# Patient Record
Sex: Male | Born: 2005 | Race: Black or African American | Hispanic: No | Marital: Single
Health system: Midwestern US, Community
[De-identification: ages and names within clinical notes are randomized; demographics above are authoritative.]

---
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
## 2023-01-25 DIAGNOSIS — S0285XS Fracture of orbit, unspecified, sequela: Secondary | ICD-10-CM | POA: Diagnosis not present

## 2023-01-25 DIAGNOSIS — H02411 Mechanical ptosis of right eyelid: Secondary | ICD-10-CM | POA: Diagnosis not present

## 2023-01-31 DIAGNOSIS — H4311 Vitreous hemorrhage, right eye: Secondary | ICD-10-CM | POA: Diagnosis not present

## 2023-01-31 DIAGNOSIS — H26131 Total traumatic cataract, right eye: Secondary | ICD-10-CM | POA: Diagnosis not present

## 2023-02-01 DIAGNOSIS — H1820 Unspecified corneal edema: Secondary | ICD-10-CM | POA: Diagnosis not present

## 2023-02-01 DIAGNOSIS — Z961 Presence of intraocular lens: Secondary | ICD-10-CM | POA: Diagnosis not present

## 2023-03-21 DIAGNOSIS — F1090 Alcohol use, unspecified, uncomplicated: Secondary | ICD-10-CM | POA: Diagnosis not present

## 2023-03-21 DIAGNOSIS — H179 Unspecified corneal scar and opacity: Secondary | ICD-10-CM | POA: Diagnosis not present

## 2023-03-21 DIAGNOSIS — H1820 Unspecified corneal edema: Secondary | ICD-10-CM | POA: Diagnosis not present

## 2023-03-22 DIAGNOSIS — Z961 Presence of intraocular lens: Secondary | ICD-10-CM | POA: Diagnosis not present

## 2023-03-22 DIAGNOSIS — Z947 Corneal transplant status: Secondary | ICD-10-CM | POA: Diagnosis not present

## 2023-03-24 DIAGNOSIS — Z947 Corneal transplant status: Secondary | ICD-10-CM | POA: Diagnosis not present

## 2023-03-24 DIAGNOSIS — Z961 Presence of intraocular lens: Secondary | ICD-10-CM | POA: Diagnosis not present

## 2023-04-03 DIAGNOSIS — Z961 Presence of intraocular lens: Secondary | ICD-10-CM | POA: Diagnosis not present

## 2023-04-03 DIAGNOSIS — Z947 Corneal transplant status: Secondary | ICD-10-CM | POA: Diagnosis not present

## 2023-04-03 DIAGNOSIS — H1820 Unspecified corneal edema: Secondary | ICD-10-CM | POA: Diagnosis not present

## 2023-04-17 DIAGNOSIS — Z961 Presence of intraocular lens: Secondary | ICD-10-CM | POA: Diagnosis not present

## 2023-04-17 DIAGNOSIS — Z947 Corneal transplant status: Secondary | ICD-10-CM | POA: Diagnosis not present

## 2023-05-07 DIAGNOSIS — H05221 Edema of right orbit: Secondary | ICD-10-CM | POA: Diagnosis not present

## 2023-05-07 DIAGNOSIS — T8131XA Disruption of external operation (surgical) wound, not elsewhere classified, initial encounter: Secondary | ICD-10-CM | POA: Diagnosis not present

## 2023-05-07 DIAGNOSIS — S0531XA Ocular laceration without prolapse or loss of intraocular tissue, right eye, initial encounter: Secondary | ICD-10-CM | POA: Diagnosis not present

## 2023-05-07 DIAGNOSIS — Z961 Presence of intraocular lens: Secondary | ICD-10-CM | POA: Diagnosis not present

## 2023-05-07 DIAGNOSIS — R220 Localized swelling, mass and lump, head: Secondary | ICD-10-CM | POA: Diagnosis not present

## 2023-05-07 DIAGNOSIS — Z947 Corneal transplant status: Secondary | ICD-10-CM | POA: Diagnosis not present

## 2023-05-07 DIAGNOSIS — H21561 Pupillary abnormality, right eye: Secondary | ICD-10-CM | POA: Diagnosis not present

## 2023-05-07 DIAGNOSIS — H4489 Other disorders of globe: Secondary | ICD-10-CM | POA: Diagnosis not present

## 2023-05-07 DIAGNOSIS — H5711 Ocular pain, right eye: Secondary | ICD-10-CM | POA: Diagnosis not present

## 2023-05-07 DIAGNOSIS — S0520XA Ocular laceration and rupture with prolapse or loss of intraocular tissue, unspecified eye, initial encounter: Secondary | ICD-10-CM | POA: Diagnosis not present

## 2023-05-07 DIAGNOSIS — W208XXA Other cause of strike by thrown, projected or falling object, initial encounter: Secondary | ICD-10-CM | POA: Diagnosis not present

## 2023-05-07 DIAGNOSIS — Y9371 Activity, boxing: Secondary | ICD-10-CM | POA: Diagnosis not present

## 2023-05-08 DIAGNOSIS — S0531XA Ocular laceration without prolapse or loss of intraocular tissue, right eye, initial encounter: Secondary | ICD-10-CM | POA: Diagnosis not present

## 2023-05-08 DIAGNOSIS — Z947 Corneal transplant status: Secondary | ICD-10-CM | POA: Diagnosis not present

## 2023-05-08 DIAGNOSIS — T8131XA Disruption of external operation (surgical) wound, not elsewhere classified, initial encounter: Secondary | ICD-10-CM | POA: Diagnosis not present

## 2023-05-09 DIAGNOSIS — Z947 Corneal transplant status: Secondary | ICD-10-CM | POA: Diagnosis not present

## 2023-05-09 DIAGNOSIS — H1820 Unspecified corneal edema: Secondary | ICD-10-CM | POA: Diagnosis not present

## 2023-05-09 DIAGNOSIS — Z961 Presence of intraocular lens: Secondary | ICD-10-CM | POA: Diagnosis not present

## 2023-05-11 DIAGNOSIS — H5789 Other specified disorders of eye and adnexa: Secondary | ICD-10-CM | POA: Diagnosis not present

## 2023-05-11 DIAGNOSIS — H5711 Ocular pain, right eye: Secondary | ICD-10-CM | POA: Diagnosis not present

## 2023-05-11 DIAGNOSIS — Z947 Corneal transplant status: Secondary | ICD-10-CM | POA: Diagnosis not present

## 2023-05-11 DIAGNOSIS — Z9889 Other specified postprocedural states: Secondary | ICD-10-CM | POA: Diagnosis not present

## 2023-05-16 DIAGNOSIS — Z961 Presence of intraocular lens: Secondary | ICD-10-CM | POA: Diagnosis not present

## 2023-05-16 DIAGNOSIS — H1820 Unspecified corneal edema: Secondary | ICD-10-CM | POA: Diagnosis not present

## 2023-05-16 DIAGNOSIS — Z947 Corneal transplant status: Secondary | ICD-10-CM | POA: Diagnosis not present

## 2023-05-31 DIAGNOSIS — Z961 Presence of intraocular lens: Secondary | ICD-10-CM | POA: Diagnosis not present

## 2023-05-31 DIAGNOSIS — Z947 Corneal transplant status: Secondary | ICD-10-CM | POA: Diagnosis not present

## 2023-05-31 DIAGNOSIS — H1820 Unspecified corneal edema: Secondary | ICD-10-CM | POA: Diagnosis not present

## 2023-07-05 DIAGNOSIS — H1820 Unspecified corneal edema: Secondary | ICD-10-CM | POA: Diagnosis not present

## 2023-07-05 DIAGNOSIS — Z947 Corneal transplant status: Secondary | ICD-10-CM | POA: Diagnosis not present

## 2023-07-05 DIAGNOSIS — Z961 Presence of intraocular lens: Secondary | ICD-10-CM | POA: Diagnosis not present

## 2023-08-04 DIAGNOSIS — Z947 Corneal transplant status: Secondary | ICD-10-CM | POA: Diagnosis not present

## 2023-08-04 DIAGNOSIS — H1820 Unspecified corneal edema: Secondary | ICD-10-CM | POA: Diagnosis not present

## 2023-08-04 DIAGNOSIS — Z961 Presence of intraocular lens: Secondary | ICD-10-CM | POA: Diagnosis not present

## 2023-09-21 DIAGNOSIS — R0981 Nasal congestion: Secondary | ICD-10-CM | POA: Diagnosis not present

## 2023-09-21 DIAGNOSIS — J029 Acute pharyngitis, unspecified: Secondary | ICD-10-CM | POA: Diagnosis not present

## 2023-09-22 DIAGNOSIS — B2790 Infectious mononucleosis, unspecified without complication: Secondary | ICD-10-CM | POA: Diagnosis not present

## 2023-10-04 DIAGNOSIS — H1820 Unspecified corneal edema: Secondary | ICD-10-CM | POA: Diagnosis not present

## 2023-10-04 DIAGNOSIS — H26491 Other secondary cataract, right eye: Secondary | ICD-10-CM | POA: Diagnosis not present

## 2023-10-04 DIAGNOSIS — Z947 Corneal transplant status: Secondary | ICD-10-CM | POA: Diagnosis not present

## 2023-10-04 DIAGNOSIS — Z961 Presence of intraocular lens: Secondary | ICD-10-CM | POA: Diagnosis not present

## 2023-11-02 DIAGNOSIS — Z9889 Other specified postprocedural states: Secondary | ICD-10-CM | POA: Diagnosis not present

## 2023-11-02 DIAGNOSIS — S0231XA Fracture of orbital floor, right side, initial encounter for closed fracture: Secondary | ICD-10-CM | POA: Diagnosis not present

## 2023-11-02 DIAGNOSIS — H02411 Mechanical ptosis of right eyelid: Secondary | ICD-10-CM | POA: Diagnosis not present

## 2023-12-06 DIAGNOSIS — Z947 Corneal transplant status: Secondary | ICD-10-CM | POA: Diagnosis not present

## 2023-12-06 DIAGNOSIS — H1820 Unspecified corneal edema: Secondary | ICD-10-CM | POA: Diagnosis not present

## 2023-12-06 DIAGNOSIS — H26491 Other secondary cataract, right eye: Secondary | ICD-10-CM | POA: Diagnosis not present

## 2023-12-06 DIAGNOSIS — Z961 Presence of intraocular lens: Secondary | ICD-10-CM | POA: Diagnosis not present

## 2024-01-04 DIAGNOSIS — H02411 Mechanical ptosis of right eyelid: Secondary | ICD-10-CM | POA: Diagnosis not present

## 2024-01-19 DIAGNOSIS — H1820 Unspecified corneal edema: Secondary | ICD-10-CM | POA: Diagnosis not present

## 2024-01-19 DIAGNOSIS — Z947 Corneal transplant status: Secondary | ICD-10-CM | POA: Diagnosis not present

## 2024-01-19 DIAGNOSIS — H26491 Other secondary cataract, right eye: Secondary | ICD-10-CM | POA: Diagnosis not present

## 2024-01-19 DIAGNOSIS — Z961 Presence of intraocular lens: Secondary | ICD-10-CM | POA: Diagnosis not present
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
## 2024-02-26 DIAGNOSIS — Z961 Presence of intraocular lens: Secondary | ICD-10-CM | POA: Diagnosis not present

## 2024-02-26 DIAGNOSIS — Z947 Corneal transplant status: Secondary | ICD-10-CM | POA: Diagnosis not present

## 2024-02-26 DIAGNOSIS — H26491 Other secondary cataract, right eye: Secondary | ICD-10-CM | POA: Diagnosis not present

## 2024-02-26 DIAGNOSIS — H1820 Unspecified corneal edema: Secondary | ICD-10-CM | POA: Diagnosis not present

## 2024-03-27 DIAGNOSIS — Z961 Presence of intraocular lens: Secondary | ICD-10-CM | POA: Diagnosis not present

## 2024-03-27 DIAGNOSIS — Z947 Corneal transplant status: Secondary | ICD-10-CM | POA: Diagnosis not present

## 2024-03-27 DIAGNOSIS — Z9889 Other specified postprocedural states: Secondary | ICD-10-CM | POA: Diagnosis not present

## 2024-03-27 DIAGNOSIS — H1820 Unspecified corneal edema: Secondary | ICD-10-CM | POA: Diagnosis not present

## 2024-06-03 DIAGNOSIS — H26491 Other secondary cataract, right eye: Secondary | ICD-10-CM | POA: Diagnosis not present

## 2024-06-03 DIAGNOSIS — H1820 Unspecified corneal edema: Secondary | ICD-10-CM | POA: Diagnosis not present

## 2024-06-03 DIAGNOSIS — Z961 Presence of intraocular lens: Secondary | ICD-10-CM | POA: Diagnosis not present

## 2024-06-03 DIAGNOSIS — Z947 Corneal transplant status: Secondary | ICD-10-CM | POA: Diagnosis not present

## 2024-06-24 DIAGNOSIS — Z0000 Encounter for general adult medical examination without abnormal findings: Secondary | ICD-10-CM | POA: Diagnosis not present

## 2024-06-24 DIAGNOSIS — F900 Attention-deficit hyperactivity disorder, predominantly inattentive type: Secondary | ICD-10-CM | POA: Diagnosis not present

## 2024-07-05 DIAGNOSIS — Z9889 Other specified postprocedural states: Secondary | ICD-10-CM | POA: Diagnosis not present

## 2024-07-05 DIAGNOSIS — H1820 Unspecified corneal edema: Secondary | ICD-10-CM | POA: Diagnosis not present

## 2024-07-05 DIAGNOSIS — Z961 Presence of intraocular lens: Secondary | ICD-10-CM | POA: Diagnosis not present

## 2024-07-05 DIAGNOSIS — Z947 Corneal transplant status: Secondary | ICD-10-CM | POA: Diagnosis not present

## 2024-09-17 DIAGNOSIS — H4311 Vitreous hemorrhage, right eye: Secondary | ICD-10-CM | POA: Diagnosis not present

## 2024-09-17 DIAGNOSIS — H26131 Total traumatic cataract, right eye: Secondary | ICD-10-CM | POA: Diagnosis not present
